# Patient Record
Sex: Male | Born: 1965 | Race: White | Hispanic: No | Marital: Married | Smoking: Former smoker
Health system: Southern US, Community
[De-identification: ages and names within clinical notes are randomized; demographics above are authoritative.]

## PROBLEM LIST (undated history)

## (undated) DIAGNOSIS — E119 Type 2 diabetes mellitus without complications: Secondary | ICD-10-CM

## (undated) HISTORY — PX: EYE SURGERY: SHX253

## (undated) HISTORY — PX: CYST EXCISION: SHX5701

---

## 2013-09-26 ENCOUNTER — Emergency Department (INDEPENDENT_AMBULATORY_CARE_PROVIDER_SITE_OTHER): Payer: BC Managed Care – PPO

## 2013-09-26 ENCOUNTER — Encounter: Payer: Self-pay | Admitting: Emergency Medicine

## 2013-09-26 ENCOUNTER — Emergency Department (INDEPENDENT_AMBULATORY_CARE_PROVIDER_SITE_OTHER)
Admission: EM | Admit: 2013-09-26 | Discharge: 2013-09-26 | Disposition: A | Discharge status: Home / Self Care | Payer: BC Managed Care – PPO | Source: Home / Self Care | Attending: Emergency Medicine | Admitting: Emergency Medicine

## 2013-09-26 DIAGNOSIS — Z9109 Other allergy status, other than to drugs and biological substances: Secondary | ICD-10-CM

## 2013-09-26 DIAGNOSIS — R079 Chest pain, unspecified: Secondary | ICD-10-CM

## 2013-09-26 DIAGNOSIS — S20212A Contusion of left front wall of thorax, initial encounter: Secondary | ICD-10-CM

## 2013-09-26 DIAGNOSIS — S20219A Contusion of unspecified front wall of thorax, initial encounter: Secondary | ICD-10-CM

## 2013-09-26 HISTORY — DX: Type 2 diabetes mellitus without complications: E11.9

## 2013-09-26 NOTE — Discharge Instructions (Signed)
X-ray left ribs negative for any rib fractures. Lungs are clear.

## 2013-09-26 NOTE — ED Notes (Signed)
Jeremy Aguirre fell outside his apartment 2.5 weeks ago. He had right side rib pain. He states the rib pain is better now.   He has episodes of headaches lasting a few seconds once every other day for the last 2 weeks. He has had ear fullness for 1 day. Denies fever, chills or sweats.

## 2013-09-26 NOTE — ED Provider Notes (Signed)
CSN: [id number]     Arrival date & time 09/26/13  1501 History   First MD Initiated Contact with Patient 09/26/13 1538     Chief Complaint  Patient presents with  . Rib Injury    2.5 weeks ago  . Headache    off and on for 2 weeks  . Nasal Congestion    in the morning for 2 weeks    HPI Jeremy Aguirre fell outside his apartment 2.5 weeks ago. He had right side rib pain. He states the rib pain is mildly improving, but he has pleuritic left lateral chest and rib pain, and states he wants to rule out rib fracture.  He has episodes of headaches lasting a few seconds once every other day for the last 2 weeks. He has had ear fullness for 1 day, but that's resolved. Denies fever, chills or sweats.  He just moved into a new home, has been cleaning it, and exposed to dust and he feels that's causing some sinus congestion and clear drainage. He denies fever or chills or colored rhinorrhea or cough or anterior chest pain or exertional chest pain or shortness of breath.  His type 2 diabetes has been controlled, he states. No hypoglycemia symptoms. He checked blood sugars at home recently that range from 110-160.  Past Medical History  Diagnosis Date  . Diabetes mellitus without complication    Past Surgical History  Procedure Laterality Date  . Eye surgery    . Cyst excision     Family History  Problem Relation Age of Onset  . Diabetes Mother   . Hypertension Father    History  Substance Use Topics  . Smoking status: Former Smoker -- 0.15 packs/day for 5 years    Types: Cigarettes  . Smokeless tobacco: Never Used  . Alcohol Use: No    Review of Systems  All other systems reviewed and are negative.    Allergies  Review of patient's allergies indicates no known allergies.  Home Medications   Current Outpatient Rx  Name  Route  Sig  Dispense  Refill  . metformin (FORTAMET) 1000 MG (OSM) 24 hr tablet   Oral   Take 1,000 mg by mouth daily with breakfast.          BP 128/88  Pulse  85  Temp(Src) 98.9 F (37.2 C) (Oral)  Ht 5' 8.5" (1.74 m)  Wt 176 lb (79.833 kg)  BMI 26.37 kg/m2  SpO2 98% Physical Exam  Nursing note and vitals reviewed. Constitutional: He is oriented to person, place, and time. He appears well-developed and well-nourished. No distress.  HENT:  Head: Normocephalic and atraumatic.  Right Ear: External ear normal.  Left Ear: External ear normal.  Mouth/Throat: Oropharynx is clear and moist. No oropharyngeal exudate.  ENT negative except nose slightly boggy turbinates slight serous drainage  Eyes: Conjunctivae and EOM are normal. Pupils are equal, round, and reactive to light. Right eye exhibits no discharge. Left eye exhibits no discharge. No scleral icterus.  Neck: Normal range of motion. Neck supple. No JVD present. No tracheal deviation present.  Cardiovascular: Normal rate, regular rhythm and normal heart sounds.  Exam reveals no gallop and no friction rub.   No murmur heard. Pulmonary/Chest: Effort normal and breath sounds normal. No respiratory distress. He has no wheezes. He has no rales. He exhibits tenderness (Left lateral chest/ribs).  Abdominal: Soft. He exhibits no distension. There is no tenderness.  Musculoskeletal: Normal range of motion. He exhibits no edema and no tenderness.  Cervical back: Normal. He exhibits normal range of motion, no tenderness, no bony tenderness and no deformity.  Lymphadenopathy:    He has no cervical adenopathy.  Neurological: He is alert and oriented to person, place, and time. He displays normal reflexes. No cranial nerve deficit. He exhibits normal muscle tone. Coordination normal.  Skin: Skin is warm. No rash noted.  Psychiatric: He has a normal mood and affect.  He appears slightly anxious, somewhat worried   No cranial tenderness or deformity ED Course  Procedures (including critical care time) Labs Review Labs Reviewed - No data to display Imaging Review Dg Ribs Unilateral W/chest  Left  09/26/2013   CLINICAL DATA:  Chest pain.  Fall.  Left rib pain.  EXAM: LEFT RIBS AND CHEST - 3+ VIEW  COMPARISON:  None.  FINDINGS: No fracture or other bone lesions are seen involving the ribs. There is no evidence of pneumothorax or pleural effusion. Both lungs are clear. Heart size and mediastinal contours are within normal limits.  IMPRESSION: Negative.   Electronically Signed   By: Charlett NoseKevin  Dover M.D.   On: 09/26/2013 16:33     MDM   1. Contusion of rib on left side   2. Environmental allergies    Discussed at length. Reviewed negative left rib x-rays. AP chest x-ray normal. Treatment options discussed, as well as risks, benefits, alternatives. Patient voiced understanding and agreement with the following plans: Ibuprofen when necessary pain. Heat and other symptomatic care. For sinus allergies, OTC Zyrtec. Other advice given  I explained that there is no sign of bacterial infection. He declined any other testing, such as blood work or blood glucose Other symptomatic care discussed. An After Visit Summary was printed and given to the patient. Follow up with your primary care physician or specialist if not improving, having worsening of symptoms, or new severe symptoms.   Precautions discussed. Red flags discussed. Questions invited and answered. Patient voiced understanding and agreement.     Lajean Manesavid Massey, MD 09/26/13 [id number]

## 2015-03-21 IMAGING — CR DG RIBS W/ CHEST 3+V*L*
3 series · 3 of 3 positions shown · non-contrast
Comparison: None.

CLINICAL DATA: Chest pain.  Fall.  Left rib pain.

EXAM:
LEFT RIBS AND CHEST - 3+ VIEW

[view not recorded (1 of 3)]
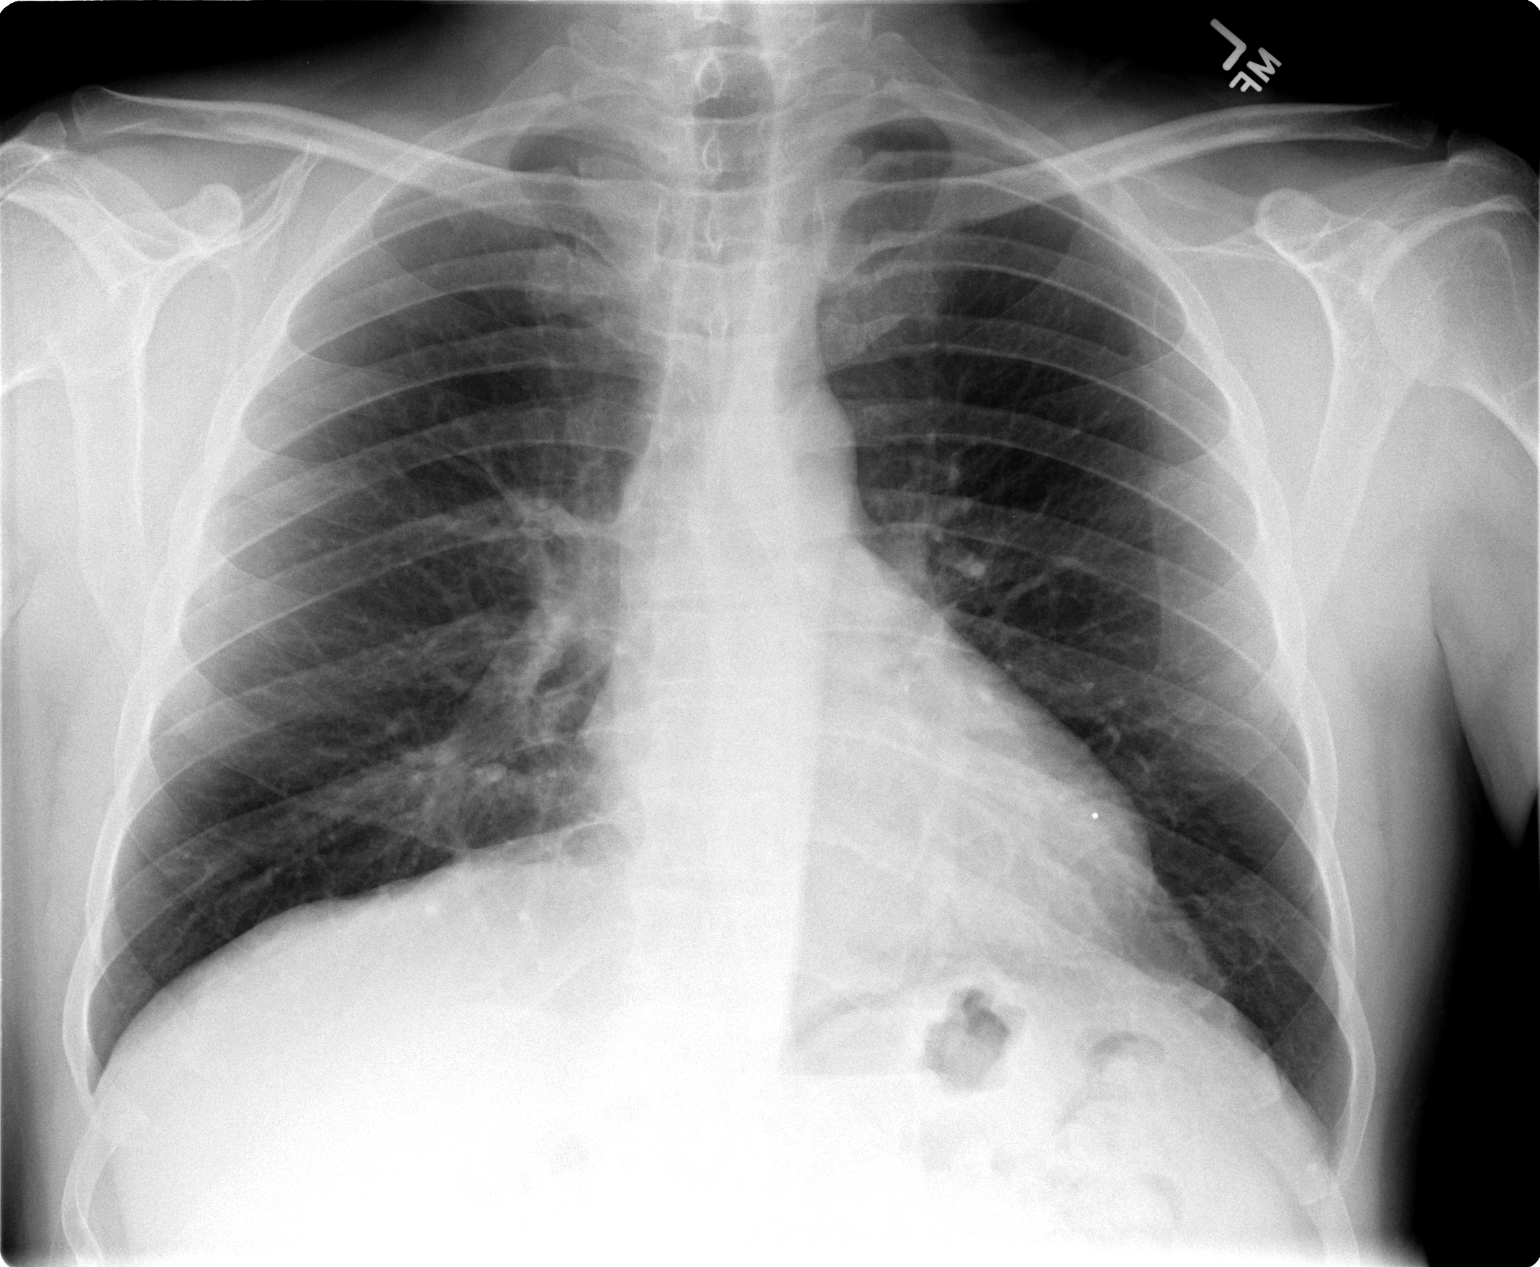

[view not recorded (2 of 3)]
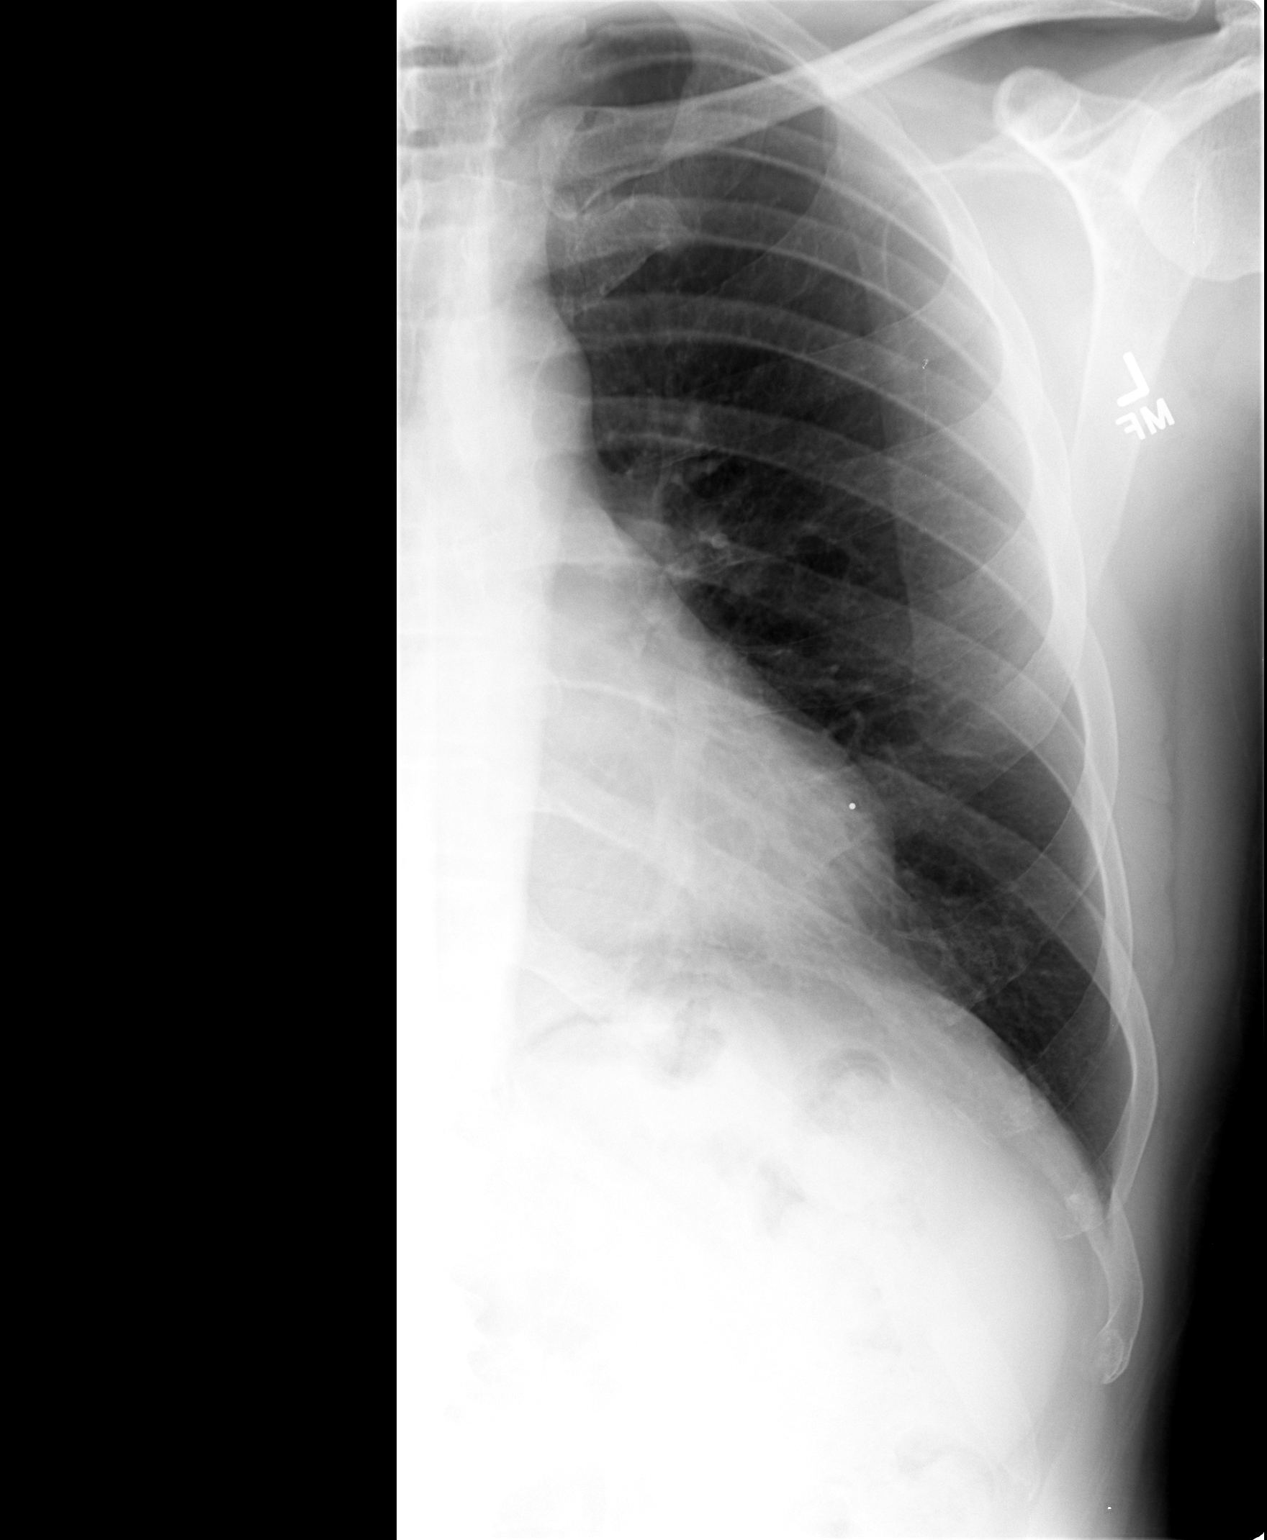

[view not recorded (3 of 3)]
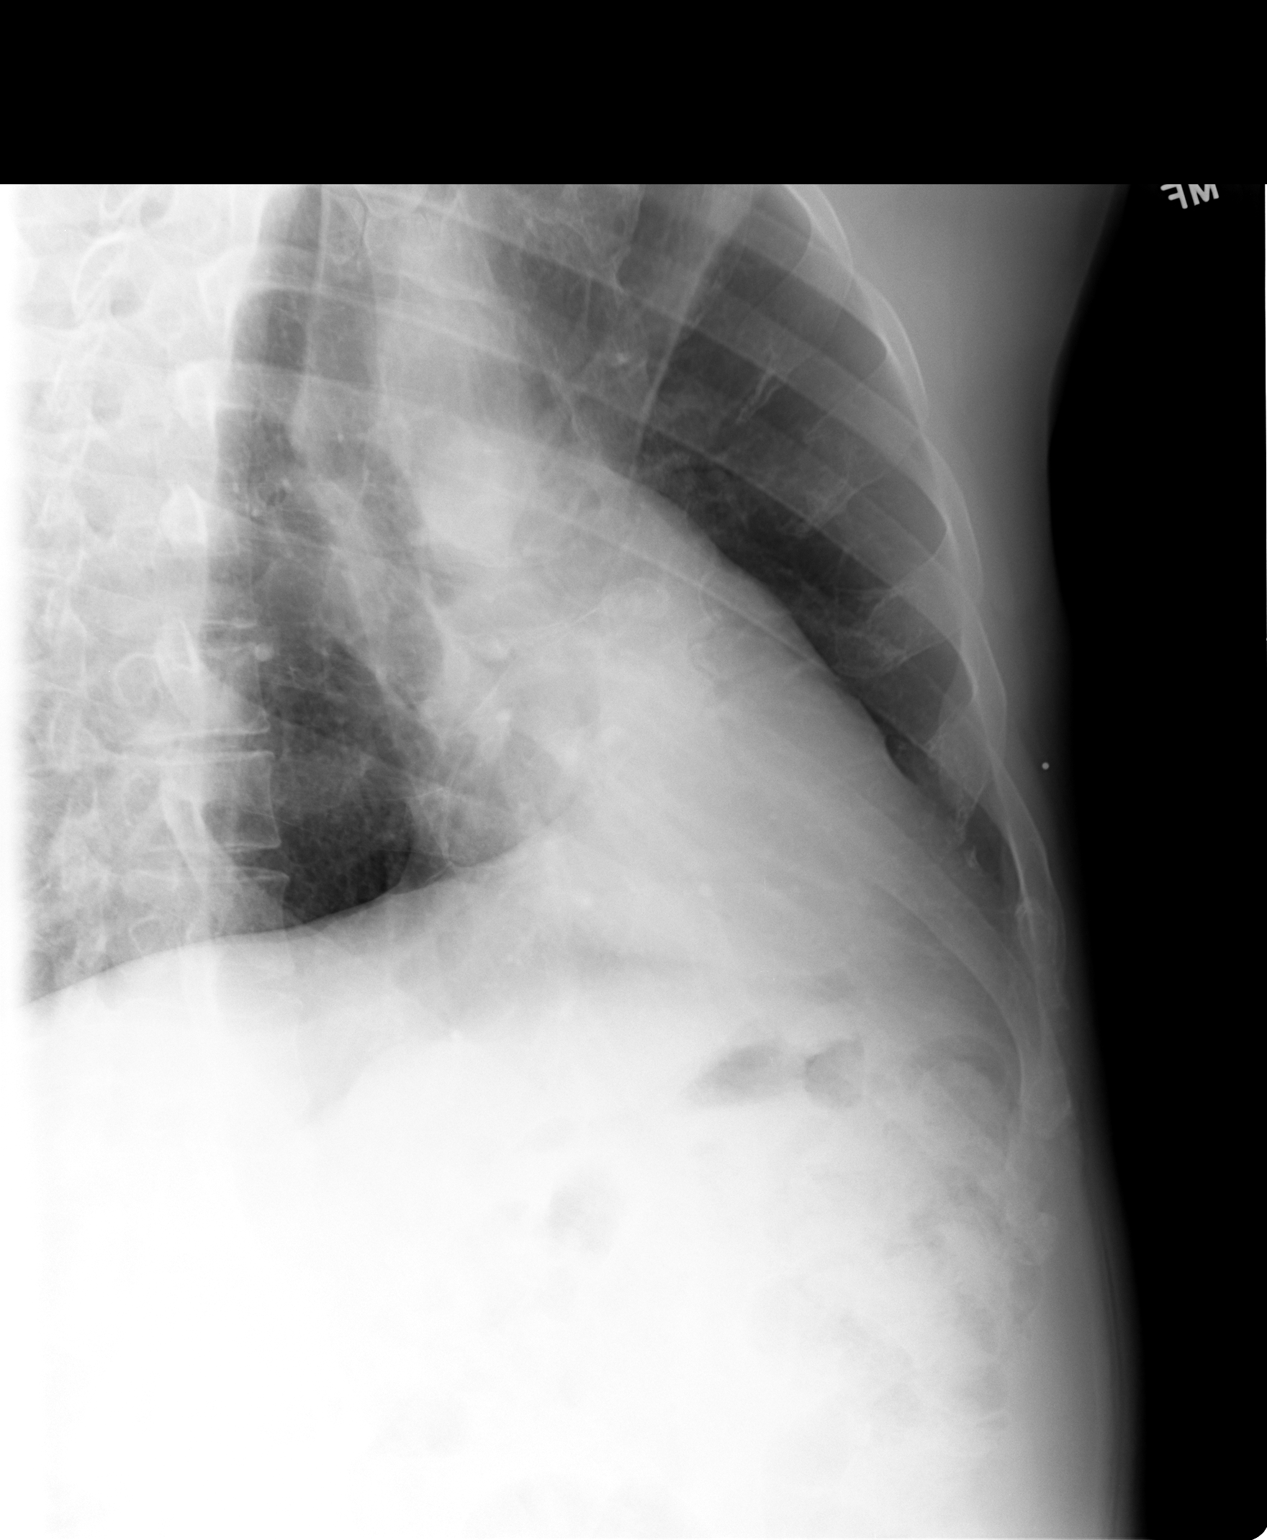

[3 of 3 positions shown; findings below may reference images not displayed]

FINDINGS: No fracture or other bone lesions are seen involving the ribs. There
is no evidence of pneumothorax or pleural effusion. Both lungs are
clear. Heart size and mediastinal contours are within normal limits.
IMPRESSION: Negative.
